# Patient Record
Sex: Male | Born: 1975 | Race: Black or African American | Hispanic: No | Marital: Single | Smoking: Current every day smoker
Health system: Southern US, Community
[De-identification: ages and names within clinical notes are randomized; demographics above are authoritative.]

---

## 2015-02-04 ENCOUNTER — Emergency Department (HOSPITAL_BASED_OUTPATIENT_CLINIC_OR_DEPARTMENT_OTHER)
Admission: EM | Admit: 2015-02-04 | Discharge: 2015-02-04 | Disposition: A | Discharge status: Home / Self Care | Payer: BLUE CROSS/BLUE SHIELD | Attending: Emergency Medicine | Admitting: Emergency Medicine

## 2015-02-04 ENCOUNTER — Emergency Department (HOSPITAL_BASED_OUTPATIENT_CLINIC_OR_DEPARTMENT_OTHER): Payer: BLUE CROSS/BLUE SHIELD

## 2015-02-04 ENCOUNTER — Encounter (HOSPITAL_BASED_OUTPATIENT_CLINIC_OR_DEPARTMENT_OTHER): Payer: Self-pay | Admitting: *Deleted

## 2015-02-04 DIAGNOSIS — Z880 Allergy status to penicillin: Secondary | ICD-10-CM | POA: Insufficient documentation

## 2015-02-04 DIAGNOSIS — Z720 Tobacco use: Secondary | ICD-10-CM | POA: Diagnosis not present

## 2015-02-04 DIAGNOSIS — R52 Pain, unspecified: Secondary | ICD-10-CM

## 2015-02-04 DIAGNOSIS — R112 Nausea with vomiting, unspecified: Secondary | ICD-10-CM | POA: Diagnosis not present

## 2015-02-04 DIAGNOSIS — K859 Acute pancreatitis, unspecified: Secondary | ICD-10-CM | POA: Diagnosis not present

## 2015-02-04 DIAGNOSIS — R109 Unspecified abdominal pain: Secondary | ICD-10-CM | POA: Diagnosis present

## 2015-02-04 LAB — COMPREHENSIVE METABOLIC PANEL
ALT: 14 U/L — ABNORMAL LOW (ref 17–63)
AST: 15 U/L (ref 15–41)
Albumin: 4 g/dL (ref 3.5–5.0)
Alkaline Phosphatase: 56 U/L (ref 38–126)
Anion gap: 10 (ref 5–15)
BILIRUBIN TOTAL: 0.6 mg/dL (ref 0.3–1.2)
BUN: 21 mg/dL — AB (ref 6–20)
CALCIUM: 9 mg/dL (ref 8.9–10.3)
CO2: 26 mmol/L (ref 22–32)
Chloride: 107 mmol/L (ref 101–111)
Creatinine, Ser: 0.92 mg/dL (ref 0.61–1.24)
GFR calc Af Amer: >60 mL/min (ref >60)
GFR calc non Af Amer: >60 mL/min (ref >60)
GLUCOSE: 103 mg/dL — AB (ref 65–99)
POTASSIUM: 3.7 mmol/L (ref 3.5–5.1)
SODIUM: 143 mmol/L (ref 135–145)
Total Protein: 7 g/dL (ref 6.5–8.1)

## 2015-02-04 LAB — CBC WITH DIFFERENTIAL/PLATELET
BASOS ABS: 0 10*3/uL (ref 0.0–0.1)
Basophils Relative: 0 % (ref 0–1)
EOS PCT: 1 % (ref 0–5)
Eosinophils Absolute: 0.1 10*3/uL (ref 0.0–0.7)
HEMATOCRIT: 44.4 % (ref 39.0–52.0)
Hemoglobin: 14.8 g/dL (ref 13.0–17.0)
LYMPHS ABS: 1.6 10*3/uL (ref 0.7–4.0)
Lymphocytes Relative: 25 % (ref 12–46)
MCH: 27.7 pg (ref 26.0–34.0)
MCHC: 33.3 g/dL (ref 30.0–36.0)
MCV: 83.1 fL (ref 78.0–100.0)
Monocytes Absolute: 0.7 10*3/uL (ref 0.1–1.0)
Monocytes Relative: 11 % (ref 3–12)
Neutro Abs: 4.1 10*3/uL (ref 1.7–7.7)
Neutrophils Relative %: 63 % (ref 43–77)
Platelets: 179 10*3/uL (ref 150–400)
RBC: 5.34 MIL/uL (ref 4.22–5.81)
RDW: 14.2 % (ref 11.5–15.5)
WBC: 6.4 10*3/uL (ref 4.0–10.5)

## 2015-02-04 LAB — LIPASE, BLOOD: Lipase: 52 U/L — ABNORMAL HIGH (ref 22–51)

## 2015-02-04 MED ORDER — OXYCODONE-ACETAMINOPHEN 7.5-325 MG PO TABS: 1.0000 | ORAL_TABLET | ORAL | Status: AC | PRN

## 2015-02-04 MED ORDER — PROMETHAZINE HCL 25 MG PO TABS: 25.0000 mg | ORAL_TABLET | Freq: Four times a day (QID) | ORAL | Status: AC | PRN

## 2015-02-04 NOTE — ED Notes (Signed)
Abdominal pain and nausea since yesterday

## 2015-02-04 NOTE — ED Provider Notes (Addendum)
CSN: [id number]     Arrival date & time 02/04/15  6387 History   First MD Initiated Contact with Patient 02/04/15 [phone]     Chief Complaint  Patient presents with  . Abdominal Pain      HPI Patient presents with abdominal pain and nausea since yesterday.  Patient denies fever.  Denies alcohol use recently although he does admit to drinking alcohol..  Does take aspirin occasionally. History reviewed. No pertinent past medical history. History reviewed. No pertinent past surgical history. History reviewed. No pertinent family history. History  Substance Use Topics  . Smoking status: Current Every Day Smoker -- 1.00 packs/day for 10 years    Types: Cigarettes  . Smokeless tobacco: Not on file  . Alcohol Use: Not on file    Review of Systems  Gastrointestinal: Positive for nausea, vomiting and abdominal pain. Negative for diarrhea, blood in stool and abdominal distention.      Allergies  Penicillins  Home Medications   Prior to Admission medications   Medication Sig Start Date End Date Taking? Authorizing Provider  oxyCODONE-acetaminophen (PERCOCET) 7.5-325 MG per tablet Take 1 tablet by mouth every 4 (four) hours as needed for severe pain. 02/04/15   Nelva Nay, MD  promethazine (PHENERGAN) 25 MG tablet Take 1 tablet (25 mg total) by mouth every 6 (six) hours as needed for nausea or vomiting. 02/04/15   Nelva Nay, MD   BP 114/71 mmHg  Pulse 52  Temp(Src) 97.9 F (36.6 C) (Oral)  Resp 18  Ht 6\' 1"  (1.854 m)  Wt 170 lb (77.111 kg)  BMI 22.43 kg/m2  SpO2 100% Physical Exam  Constitutional: He is oriented to person, place, and time. He appears well-developed and well-nourished. No distress.  HENT:  Head: Normocephalic and atraumatic.  Eyes: Pupils are equal, round, and reactive to light.  Neck: Normal range of motion.  Cardiovascular: Normal rate and intact distal pulses.   Pulmonary/Chest: No respiratory distress.  Abdominal: Soft. Normal appearance and bowel  sounds are normal. He exhibits no distension. There is no tenderness. There is no rebound and no guarding.  Musculoskeletal: Normal range of motion.  Neurological: He is alert and oriented to person, place, and time. No cranial nerve deficit.  Skin: Skin is warm and dry. No rash noted.  Psychiatric: He has a normal mood and affect. His behavior is normal.  Nursing note and vitals reviewed.   ED Course  Procedures (including critical care time) Labs Review Labs Reviewed  COMPREHENSIVE METABOLIC PANEL - Abnormal; Notable for the following:    Glucose, Bld 103 (*)    BUN 21 (*)    ALT 14 (*)    All other components within normal limits  LIPASE, BLOOD - Abnormal; Notable for the following:    Lipase 52 (*)    All other components within normal limits  CBC WITH DIFFERENTIAL/PLATELET  URINALYSIS, ROUTINE W REFLEX MICROSCOPIC (NOT AT [facility])    Imaging Review Dg Abd 1 View  02/04/2015   CLINICAL DATA:  Intermittent right-sided abdominal pain  EXAM: ABDOMEN - 1 VIEW  COMPARISON:  None  FINDINGS: The bowel gas pattern is normal. No radio-opaque calculi or other significant radiographic abnormality are seen.  IMPRESSION: Negative.   Electronically Signed   By: Signa Kell M.D.   On: 02/04/2015 08:36      MDM   Final diagnoses:  Pain  Acute pancreatitis, unspecified pancreatitis type        Nelva Nay, MD 02/04/15 [phone]

## 2015-02-04 NOTE — ED Notes (Signed)
Pt states vomited x 2 last pm

## 2015-02-04 NOTE — ED Notes (Signed)
MD at bedside. To discuss lab results and dx

## 2015-02-04 NOTE — Discharge Instructions (Signed)

## 2015-02-04 NOTE — ED Notes (Signed)
MD at bedside. 

## 2015-02-04 NOTE — ED Notes (Signed)
Work excuse provided to pt from EDP

## 2015-02-04 NOTE — ED Notes (Signed)
Patient transported to X-ray 

## 2016-10-05 IMAGING — CR DG ABDOMEN 1V
1 series · 1 of 1 positions shown · non-contrast
Comparison: None

CLINICAL DATA: Intermittent right-sided abdominal pain

EXAM:
ABDOMEN - 1 VIEW

[t abdomen supine]
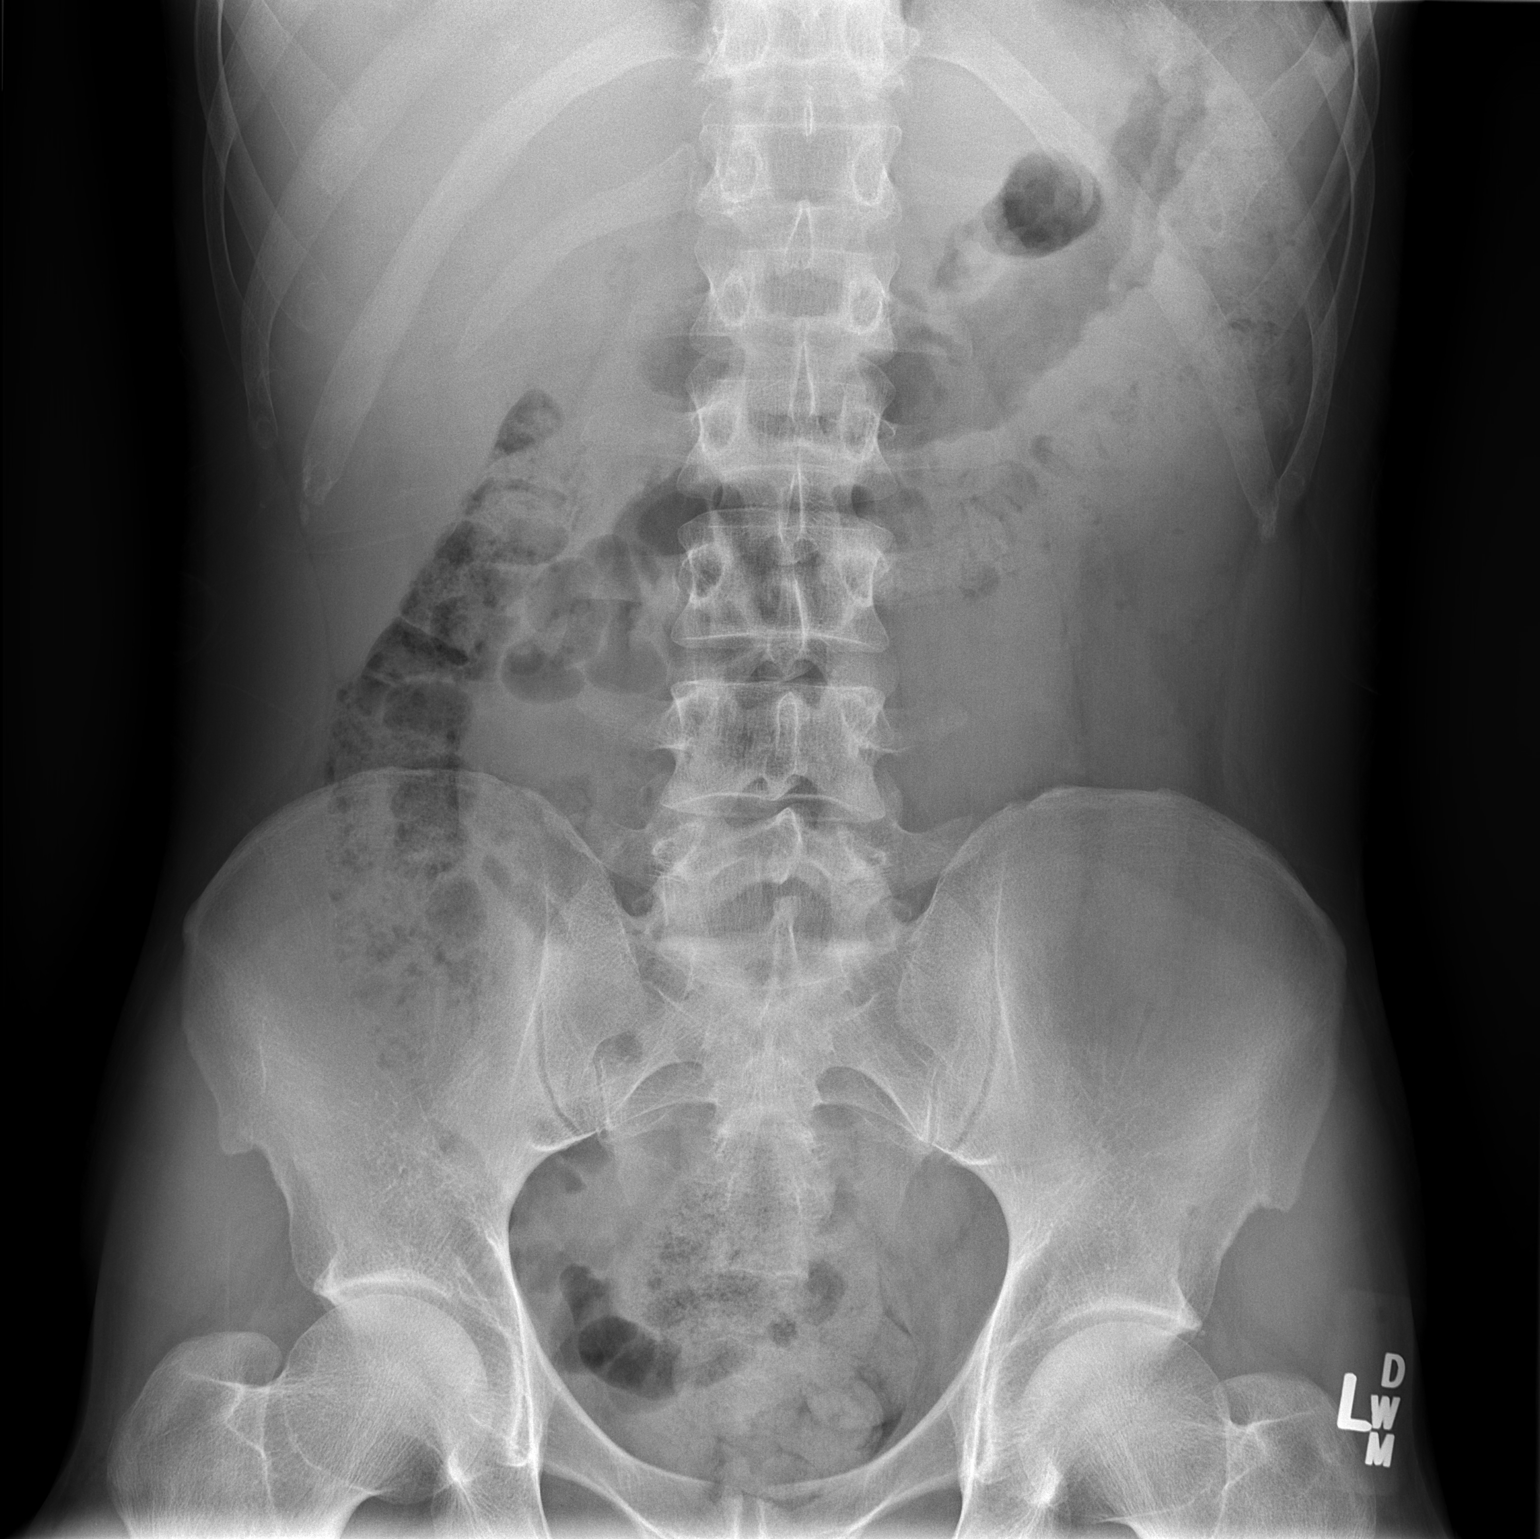

[1 of 1 positions shown; findings below may reference images not displayed]

FINDINGS: The bowel gas pattern is normal. No radio-opaque calculi or other
significant radiographic abnormality are seen.
IMPRESSION: Negative.
